# Patient Record
Sex: Female | Born: 1985 | Race: White | Hispanic: No | Smoking: Never smoker
Health system: Southern US, Community
[De-identification: ages and names within clinical notes are randomized; demographics above are authoritative.]

## PROBLEM LIST (undated history)

## (undated) DIAGNOSIS — O24419 Gestational diabetes mellitus in pregnancy, unspecified control: Secondary | ICD-10-CM

---

## 2006-02-16 ENCOUNTER — Inpatient Hospital Stay (HOSPITAL_COMMUNITY): Admission: AD | Admit: 2006-02-16 | Discharge: 2006-02-19 | Payer: Self-pay | Admitting: Obstetrics and Gynecology

## 2006-05-15 ENCOUNTER — Emergency Department (HOSPITAL_COMMUNITY): Admission: EM | Admit: 2006-05-15 | Discharge: 2006-05-15 | Payer: Self-pay | Admitting: Emergency Medicine

## 2006-11-09 ENCOUNTER — Emergency Department (HOSPITAL_COMMUNITY): Admission: EM | Admit: 2006-11-09 | Discharge: 2006-11-09 | Payer: Self-pay | Admitting: Family Medicine

## 2007-10-09 ENCOUNTER — Inpatient Hospital Stay (HOSPITAL_COMMUNITY): Admission: AD | Admit: 2007-10-09 | Discharge: 2007-10-09 | Payer: Self-pay | Admitting: Obstetrics and Gynecology

## 2007-11-02 ENCOUNTER — Inpatient Hospital Stay (HOSPITAL_COMMUNITY): Admission: RE | Admit: 2007-11-02 | Discharge: 2007-11-04 | Payer: Self-pay | Admitting: Obstetrics and Gynecology

## 2008-02-19 ENCOUNTER — Ambulatory Visit: Payer: Self-pay | Admitting: Family Medicine

## 2009-01-13 ENCOUNTER — Ambulatory Visit: Payer: Self-pay | Admitting: Family Medicine

## 2010-03-17 ENCOUNTER — Inpatient Hospital Stay (HOSPITAL_COMMUNITY): Admission: AD | Admit: 2010-03-17 | Discharge: 2010-03-17 | Payer: Self-pay | Admitting: Obstetrics and Gynecology

## 2010-03-19 ENCOUNTER — Inpatient Hospital Stay (HOSPITAL_COMMUNITY): Admission: AD | Admit: 2010-03-19 | Discharge: 2010-03-21 | Payer: Self-pay | Admitting: Obstetrics and Gynecology

## 2010-03-20 ENCOUNTER — Encounter (INDEPENDENT_AMBULATORY_CARE_PROVIDER_SITE_OTHER): Payer: Self-pay | Admitting: Obstetrics and Gynecology

## 2010-11-13 LAB — COMPREHENSIVE METABOLIC PANEL
AST: 14 U/L (ref 0–37)
Alkaline Phosphatase: 92 U/L (ref 39–117)
BUN: 5 mg/dL — ABNORMAL LOW (ref 6–23)
CO2: 20 mEq/L (ref 19–32)
Calcium: 8.6 mg/dL (ref 8.4–10.5)
Chloride: 110 mEq/L (ref 96–112)
Creatinine, Ser: 0.69 mg/dL (ref 0.4–1.2)
GFR calc non Af Amer: >60 mL/min (ref >60)
Glucose, Bld: 103 mg/dL — ABNORMAL HIGH (ref 70–99)
Potassium: 3.5 mEq/L (ref 3.5–5.1)
Sodium: 137 mEq/L (ref 135–145)
Total Protein: 6.2 g/dL (ref 6.0–8.3)

## 2010-11-13 LAB — CBC
HCT: 29.6 % — ABNORMAL LOW (ref 36.0–46.0)
HCT: 33.6 % — ABNORMAL LOW (ref 36.0–46.0)
Hemoglobin: 10 g/dL — ABNORMAL LOW (ref 12.0–15.0)
Hemoglobin: 11.5 g/dL — ABNORMAL LOW (ref 12.0–15.0)
MCV: 89.7 fL (ref 78.0–100.0)
Platelets: 184 10*3/uL (ref 150–400)
Platelets: 250 10*3/uL (ref 150–400)
WBC: 10.9 10*3/uL — ABNORMAL HIGH (ref 4.0–10.5)

## 2011-04-27 ENCOUNTER — Inpatient Hospital Stay (INDEPENDENT_AMBULATORY_CARE_PROVIDER_SITE_OTHER)
Admission: RE | Admit: 2011-04-27 | Discharge: 2011-04-27 | Disposition: A | Discharge status: Home / Self Care | Payer: BC Managed Care – PPO | Source: Ambulatory Visit | Attending: Family Medicine | Admitting: Family Medicine

## 2011-04-27 DIAGNOSIS — M719 Bursopathy, unspecified: Secondary | ICD-10-CM

## 2011-04-27 DIAGNOSIS — M799 Soft tissue disorder, unspecified: Secondary | ICD-10-CM

## 2011-05-20 LAB — LACTATE DEHYDROGENASE: LDH: 129

## 2011-05-20 LAB — CBC
HCT: 35.8 — ABNORMAL LOW
MCV: 89.1
Platelets: 271

## 2011-05-20 LAB — COMPREHENSIVE METABOLIC PANEL
ALT: 17
Calcium: 8.8
GFR calc Af Amer: >60
GFR calc non Af Amer: >60
Total Bilirubin: 0.5

## 2011-05-20 LAB — RPR: RPR Ser Ql: NONREACTIVE

## 2011-05-20 LAB — URIC ACID: Uric Acid, Serum: 5.1

## 2011-05-23 LAB — COMPREHENSIVE METABOLIC PANEL
ALT: <8
Albumin: 2.5 — ABNORMAL LOW
BUN: 8
CO2: 23
Calcium: 8.5
GFR calc Af Amer: >60
GFR calc non Af Amer: >60
Potassium: 4
Total Bilirubin: 0.8

## 2011-05-23 LAB — RPR: RPR Ser Ql: NONREACTIVE

## 2011-05-23 LAB — URIC ACID: Uric Acid, Serum: 5

## 2011-05-23 LAB — CBC
HCT: 30.4 — ABNORMAL LOW
HCT: 34.2 — ABNORMAL LOW
Hemoglobin: 11.8 — ABNORMAL LOW
MCHC: 34.6
MCV: 89.9
Platelets: 220
Platelets: 253
RBC: 3.8 — ABNORMAL LOW

## 2012-03-03 ENCOUNTER — Emergency Department (INDEPENDENT_AMBULATORY_CARE_PROVIDER_SITE_OTHER)
Admission: EM | Admit: 2012-03-03 | Discharge: 2012-03-03 | Disposition: A | Discharge status: Home / Self Care | Payer: Self-pay | Source: Home / Self Care | Attending: Emergency Medicine | Admitting: Emergency Medicine

## 2012-03-03 ENCOUNTER — Encounter (HOSPITAL_COMMUNITY): Payer: Self-pay | Admitting: *Deleted

## 2012-03-03 DIAGNOSIS — J020 Streptococcal pharyngitis: Secondary | ICD-10-CM

## 2012-03-03 MED ORDER — PENICILLIN G BENZATHINE 1200000 UNIT/2ML IM SUSP
1.2000 10*6.[IU] | Freq: Once | INTRAMUSCULAR | Status: AC
  Administered 2012-03-03: 1.2 10*6.[IU] via INTRAMUSCULAR

## 2012-03-03 MED ORDER — PENICILLIN G BENZATHINE 1200000 UNIT/2ML IM SUSP
INTRAMUSCULAR | Status: AC
  Filled 2012-03-03: qty 2

## 2012-03-03 NOTE — ED Provider Notes (Signed)
Chief Complaint  Patient presents with  . Sore Throat  . Fever    History of Present Illness:   Kristina Henderson is a 26 year old female who's had a three-day history of severe sore throat, and pain on swallowing. Her symptoms came on after she took her kids into their pediatrician's office. She also has had fever, chills, headache, muscle aches, left ear pain, and anorexia. She denies any nasal congestion, rhinorrhea, cough, or GI complaints.  Review of Systems:  Other than as noted above, the patient denies any of the following symptoms. Systemic:  No fever, chills, sweats, fatigue, myalgias, headache, or anorexia. Eye:  No redness, pain or drainage. ENT:  No earache, ear congestion, nasal congestion, sneezing, rhinorrhea, sinus pressure, sinus pain, or post nasal drip. Lungs:  No cough, sputum production, wheezing, shortness of breath, or chest pain. GI:  No abdominal pain, nausea, vomiting, or diarrhea. Skin:  No rash or itching.  PMFSH:  Past medical history, family history, social history, meds, allergies, and nurse's notes were reviewed.  There is no known exposure to strep or mono.  No prior history of step or mono.  The patient denies use of tobacco.  Physical Exam:   Vital signs:  BP 155/87  Pulse 86  Temp 98.1 F (36.7 C) (Oral)  Resp 18  SpO2 100% General:  Alert, in no distress. Eye:  No conjunctival injection or drainage. Lids were normal. ENT:  TMs and canals were normal, without erythema or inflammation.  Nasal mucosa was clear and uncongested, without drainage.  Mucous membranes were moist.  Exam of pharynx reveals tonsils to be enlarged and red with copious white exudate or.  There were no oral ulcerations or lesions. Neck:  Supple, with bilateral, tender anterior cervical adenopathy, no posterior cervical adenopathy. Lungs:  No respiratory distress.  Lungs were clear to auscultation, without wheezes, rales or rhonchi.  Breath sounds were clear and equal bilaterally.  Heart:   Regular rhythm, without gallops, murmers or rubs. Skin:  Clear, warm, and dry, without rash or lesions.  Labs:   Results for orders placed during the hospital encounter of 03/03/12  POCT RAPID STREP A ([facility] ONLY)      Component Value Range   Streptococcus, Group A Screen (Direct) POSITIVE (*) NEGATIVE   Course in Urgent Care Center:   She was given LA Bicillin 1.2 million units IM and tolerated this well without any immediate side effects.  Assessment:  The encounter diagnosis was Strep throat.  Plan:   1.  The following meds were prescribed:   New Prescriptions   No medications on file   2.  The patient was instructed in symptomatic care including hot saline gargles, throat lozenges, infectious precautions, and need to trade out toothbrush. Handouts were given. 3.  The patient was told to return if becoming worse in any way, if no better in 3 or 4 days, and given some red flag symptoms that would indicate earlier return.    Reuben Likes, MD 03/03/12 [id number]

## 2012-03-03 NOTE — ED Notes (Signed)
Pt with onset of fever and sore throat July 4th - advi 7am this morning - denies cough or congestion

## 2015-08-01 ENCOUNTER — Emergency Department (HOSPITAL_COMMUNITY)
Admission: EM | Admit: 2015-08-01 | Discharge: 2015-08-01 | Disposition: A | Discharge status: Home / Self Care | Payer: No Typology Code available for payment source | Attending: Emergency Medicine | Admitting: Emergency Medicine

## 2015-08-01 ENCOUNTER — Encounter (HOSPITAL_COMMUNITY): Payer: Self-pay | Admitting: Emergency Medicine

## 2015-08-01 ENCOUNTER — Emergency Department (HOSPITAL_COMMUNITY): Payer: No Typology Code available for payment source

## 2015-08-01 DIAGNOSIS — S199XXA Unspecified injury of neck, initial encounter: Secondary | ICD-10-CM | POA: Insufficient documentation

## 2015-08-01 DIAGNOSIS — S0990XA Unspecified injury of head, initial encounter: Secondary | ICD-10-CM | POA: Diagnosis not present

## 2015-08-01 DIAGNOSIS — Y998 Other external cause status: Secondary | ICD-10-CM | POA: Insufficient documentation

## 2015-08-01 DIAGNOSIS — M542 Cervicalgia: Secondary | ICD-10-CM

## 2015-08-01 DIAGNOSIS — Y9389 Activity, other specified: Secondary | ICD-10-CM | POA: Diagnosis not present

## 2015-08-01 DIAGNOSIS — Y92410 Unspecified street and highway as the place of occurrence of the external cause: Secondary | ICD-10-CM | POA: Insufficient documentation
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
## 2015-08-01 NOTE — Discharge Instructions (Signed)
Head Injury, Adult You have a head injury. Headaches and throwing up (vomiting) are common after a head injury. It should be easy to wake up from sleeping. Sometimes you must stay in the hospital. Most problems happen within the first 24 hours. Side effects may occur up to 7-10 days after the injury.  WHAT ARE THE TYPES OF HEAD INJURIES? Head injuries can be as minor as a bump. Some head injuries can be more severe. More severe head injuries include:  A jarring injury to the brain (concussion).  A bruise of the brain (contusion). This mean there is bleeding in the brain that can cause swelling.  A cracked skull (skull fracture).  Bleeding in the brain that collects, clots, and forms a bump (hematoma). WHEN SHOULD I GET HELP RIGHT AWAY?   You are confused or sleepy.  You cannot be woken up.  You feel sick to your stomach (nauseous) or keep throwing up (vomiting).  Your dizziness or unsteadiness is getting worse.  You have very bad, lasting headaches that are not helped by medicine. Take medicines only as told by your doctor.  You cannot use your arms or legs like normal.  You cannot walk.  You notice changes in the black spots in the center of the colored part of your eye (pupil).  You have clear or bloody fluid coming from your nose or ears.  You have trouble seeing. During the next 24 hours after the injury, you must stay with someone who can watch you. This person should get help right away (call 911 in the [place] if you start to shake and are not able to control it (have seizures), you pass out, or you are unable to wake up. HOW CAN I PREVENT A HEAD INJURY IN THE FUTURE?  Wear seat belts.  Wear a helmet while bike riding and playing sports like football.  Stay away from dangerous activities around the house. WHEN CAN I RETURN TO NORMAL ACTIVITIES AND ATHLETICS? See your doctor before doing these activities. You should not do normal activities or play contact sports until 1  week after the following symptoms have stopped:  Headache that does not go away.  Dizziness.  Poor attention.  Confusion.  Memory problems.  Sickness to your stomach or throwing up.  Tiredness.  Fussiness.  Bothered by bright lights or loud noises.  Anxiousness or depression.  Restless sleep. MAKE SURE YOU:   Understand these instructions.  Will watch your condition.  Will get help right away if you are not doing well or get worse.   This information is not intended to replace advice given to you by your health care provider. Make sure you discuss any questions you have with your health care provider.   Document Released: 07/28/2008 Document Revised: 09/05/2014 Document Reviewed: 04/22/2013 Elsevier Interactive Patient Education 2016 [employer]  Musculoskeletal Pain Musculoskeletal pain is muscle and boney aches and pains. These pains can occur in any part of the body. Your caregiver may treat you without knowing the cause of the pain. They may treat you if blood or urine tests, X-rays, and other tests were normal.  CAUSES There is often not a definite cause or reason for these pains. These pains may be caused by a type of germ (virus). The discomfort may also come from overuse. Overuse includes working out too hard when your body is not fit. Boney aches also come from weather changes. Bone is sensitive to atmospheric pressure changes. HOME CARE INSTRUCTIONS   Ask  when your test results will be ready. Make sure you get your test results. °· Only take over-the-counter or prescription medicines for pain, discomfort, or fever as directed by your caregiver. If you were given medications for your condition, do not drive, operate machinery or power tools, or sign legal documents for 24 hours. Do not drink alcohol. Do not take sleeping pills or other medications that may interfere with treatment. °· Continue all activities unless the activities cause more pain. When the pain  lessens, slowly resume normal activities. Gradually increase the intensity and duration of the activities or exercise. °· During periods of severe pain, bed rest may be helpful. Lay or sit in any position that is comfortable. °· Putting ice on the injured area. °¨ Put ice in a bag. °¨ Place a towel between your skin and the bag. °¨ Leave the ice on for 15 to 20 minutes, 3 to 4 times a day. °· Follow up with your caregiver for continued problems and no reason can be found for the pain. If the pain becomes worse or does not go away, it may be necessary to repeat tests or do additional testing. Your caregiver may need to look further for a possible cause. °SEEK IMMEDIATE MEDICAL CARE IF: °· You have pain that is getting worse and is not relieved by medications. °· You develop chest pain that is associated with shortness or breath, sweating, feeling sick to your stomach (nauseous), or throw up (vomit). °· Your pain becomes localized to the abdomen. °· You develop any new symptoms that seem different or that concern you. °MAKE SURE YOU:  °· Understand these instructions. °· Will watch your condition. °· Will get help right away if you are not doing well or get worse. °  °This information is not intended to replace advice given to you by your health care provider. Make sure you discuss any questions you have with your health care provider. °  °Document Released: 08/15/2005 Document Revised: 11/07/2011 Document Reviewed: 04/19/2013 °Elsevier Interactive Patient Education ©2016 Elsevier Inc. ° °

## 2015-12-27 ENCOUNTER — Encounter (HOSPITAL_COMMUNITY): Payer: Self-pay | Admitting: *Deleted

## 2015-12-27 ENCOUNTER — Emergency Department (HOSPITAL_COMMUNITY): Payer: No Typology Code available for payment source

## 2015-12-27 ENCOUNTER — Emergency Department (HOSPITAL_COMMUNITY)
Admission: EM | Admit: 2015-12-27 | Discharge: 2015-12-27 | Disposition: A | Discharge status: Home / Self Care | Payer: No Typology Code available for payment source | Attending: Emergency Medicine | Admitting: Emergency Medicine

## 2015-12-27 DIAGNOSIS — S9031XA Contusion of right foot, initial encounter: Secondary | ICD-10-CM | POA: Insufficient documentation

## 2015-12-27 DIAGNOSIS — S82434A Nondisplaced oblique fracture of shaft of right fibula, initial encounter for closed fracture: Secondary | ICD-10-CM | POA: Insufficient documentation

## 2015-12-27 DIAGNOSIS — X58XXXA Exposure to other specified factors, initial encounter: Secondary | ICD-10-CM | POA: Insufficient documentation

## 2015-12-27 DIAGNOSIS — S82401A Unspecified fracture of shaft of right fibula, initial encounter for closed fracture: Secondary | ICD-10-CM

## 2015-12-27 DIAGNOSIS — Y998 Other external cause status: Secondary | ICD-10-CM | POA: Insufficient documentation

## 2015-12-27 DIAGNOSIS — Y9289 Other specified places as the place of occurrence of the external cause: Secondary | ICD-10-CM | POA: Insufficient documentation

## 2015-12-27 DIAGNOSIS — Z8632 Personal history of gestational diabetes: Secondary | ICD-10-CM | POA: Insufficient documentation

## 2015-12-27 DIAGNOSIS — S93401A Sprain of unspecified ligament of right ankle, initial encounter: Secondary | ICD-10-CM

## 2015-12-27 DIAGNOSIS — Y9389 Activity, other specified: Secondary | ICD-10-CM | POA: Insufficient documentation

## 2015-12-27 HISTORY — DX: Gestational diabetes mellitus in pregnancy, unspecified control: O24.419

## 2015-12-27 MED ORDER — HYDROCODONE-ACETAMINOPHEN 5-325 MG PO TABS: ORAL_TABLET | ORAL | Status: AC

## 2015-12-27 MED ORDER — HYDROCODONE-ACETAMINOPHEN 5-325 MG PO TABS
1.0000 | ORAL_TABLET | Freq: Once | ORAL | Status: AC
  Administered 2015-12-27: 1 via ORAL
  Filled 2015-12-27: qty 1
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
## 2015-12-27 NOTE — Discharge Instructions (Signed)
Please read and follow all provided instructions.  Your diagnoses today include:  1. Fibula fracture, right, closed, initial encounter   2. Sprain of right ankle, initial encounter     Tests performed today include:  An x-ray of your ankle - shows that the end of your fibula is broken  Vital signs. See below for your results today.   Medications prescribed:   Vicodin (hydrocodone/acetaminophen) - narcotic pain medication  DO NOT drive or perform any activities that require you to be awake and alert because this medicine can make you drowsy. BE VERY CAREFUL not to take multiple medicines containing Tylenol (also called acetaminophen). Doing so can lead to an overdose which can damage your liver and cause liver failure and possibly death.  Take any prescribed medications only as directed.  Home care instructions:   Follow any educational materials contained in this packet  Follow R.I.C.E. Protocol:  R - rest your injury   I  - use ice on injury without applying directly to skin  C - compress injury with bandage or splint  E - elevate the injury as much as possible  Follow-up instructions: Please follow-up with your primary care provider or the provided orthopedic (bone specialist) if you continue to have significant pain or trouble walking in 1 week. In this case you may have a severe sprain that requires further care.   Return instructions:   Please return if your toes are numb or tingling, appear gray or blue, or you have severe pain (also elevate leg and loosen splint or wrap)  Please return to the Emergency Department if you experience worsening symptoms.   Please return if you have any other emergent concerns.  Additional Information:  Your vital signs today were: BP 129/87 mmHg   Pulse 76   Temp(Src) 98.5 F (36.9 C) (Oral)   Resp 16   Ht 5\' 10"  (1.778 m)   Wt 104.327 kg   BMI 33.00 kg/m2   SpO2 100% If your blood pressure (BP) was elevated above 135/85 this  visit, please have this repeated by your doctor within one month. --------------

## 2015-12-27 NOTE — ED Provider Notes (Signed)
CSN: [id number]     Arrival date & time 12/27/15  2157 History  By signing my name below, I, Doreatha Martin, attest that this documentation has been prepared under the direction and in the presence of Renne Crigler, PA-C. Electronically Signed: Doreatha Martin, ED Scribe. 12/27/2015. 10:17 PM.    Chief Complaint  Patient presents with  . Ankle Injury   The history is provided by the patient. No language interpreter was used.   HPI Comments: Kristina Henderson is a 30 y.o. female who presents to the Emergency Department complaining of moderate, worsening dorsal right foot pain onset 3 days ago s/p injury with associated ecchymosis and right ankle swelling. Pt states that she inverted her ankle as she stepped off her bed, causing her immediate pain, swelling and bruising. Denies additional trauma or injuries, falls. Pt is ambulatory, but with some difficulty secondary to pain. Pt states that pain is worsened with movement, ambulation and weight-bearing. She reports some relief with antiinflammatories and RICE method. Denies numbness, weakness, paresthesia, additional injuries. Pt is not currently followed by a PCP.    Past Medical History  Diagnosis Date  . Gestational diabetes    History reviewed. No pertinent past surgical history. No family history on file. Social History  Substance Use Topics  . Smoking status: Never Smoker   . Smokeless tobacco: Never Used  . Alcohol Use: No   OB History    No data available     Review of Systems  Constitutional: Negative for activity change.  Musculoskeletal: Positive for joint swelling (right ankle) and arthralgias (right ankle). Negative for back pain and neck pain.  Skin: Positive for color change (ecchymosis). Negative for wound.  Neurological: Negative for weakness and numbness.       Negative for paresthesias   Allergies  Ceclor; Naproxen; and Sulfa antibiotics  Home Medications   Prior to Admission medications   Medication Sig Start Date End  Date Taking? Authorizing Provider  ibuprofen (ADVIL,MOTRIN) 200 MG tablet Take 800 mg by mouth every 6 (six) hours as needed.    Historical Provider, MD  levonorgestrel (MIRENA) 20 MCG/24HR IUD 1 each by Intrauterine route once.    Historical Provider, MD   BP 129/87 mmHg  Pulse 76  Temp(Src) 98.5 F (36.9 C) (Oral)  Resp 16  Ht  (1.778 m)  Wt 230 lb (104.327 kg)  BMI 33.00 kg/m2  SpO2 100% Physical Exam  Constitutional: She appears well-developed and well-nourished.  HENT:  Head: Normocephalic and atraumatic.  Eyes: Conjunctivae are normal. Pupils are equal, round, and reactive to light.  Neck: Normal range of motion. Neck supple.  Cardiovascular: Normal rate.  Exam reveals no decreased pulses.   Pulmonary/Chest: Effort normal. No respiratory distress.  Abdominal: She exhibits no distension.  Musculoskeletal: Normal range of motion. She exhibits edema and tenderness.  Tenderness to the lateral malleolus and dorsum of the right foot. Dependent edema. Diffuse ecchymosis in dependent areas. No obvious deformity. No joint effusion. Able to plantar and dorsiflex. Able to flex and extend toes. Capillary refill less than 3 seconds. No proximal fibular tenderness.   Neurological: She is alert. No sensory deficit. Coordination normal.  Sensation equal and intact bilaterally. DP pulses 2+ and equal. Coordination intact. Antalgic gait.   Skin: Skin is warm and dry.  Psychiatric: She has a normal mood and affect. Her behavior is normal.  Nursing note and vitals reviewed.   ED Course  Procedures (including critical care time) DIAGNOSTIC STUDIES: Oxygen Saturation is  99% on RA, normal by my interpretation.    COORDINATION OF CARE: 10:12 PM Discussed treatment plan with pt at bedside which includes XR and pt agreed to plan.   Imaging Review Dg Ankle Complete Right  12/27/2015  CLINICAL DATA:  Pain and swelling after injury 3 days ago. EXAM: RIGHT ANKLE - COMPLETE 3+ VIEW COMPARISON:   None. FINDINGS: There is a nondisplaced well aligned oblique fracture of the distal fibula. The mortise is symmetric. There is no radiopaque foreign body. IMPRESSION: Distal fibular fracture. Electronically Signed   By: Ellery Plunkaniel R Mitchell M.D.   On: 12/27/2015 22:34   I have personally reviewed and evaluated these images as part of my medical decision-making.  Patient counseled on use of narcotic pain medications. Counseled not to combine these medications with others containing tylenol. Urged not to drink alcohol, drive, or perform any other activities that requires focus while taking these medications. The patient verbalizes understanding and agrees with the plan.    MDM   Final diagnoses:  None    Patient X-Ray shows distal fibular fracture.  Pt advised to follow up with orthopedics. Patient given cam walker, crutches while in ED, conservative therapy recommended and discussed. Patient will be discharged home & is agreeable with above plan. Returns precautions discussed. Pt appears safe for discharge.   I personally performed the services described in this documentation, which was scribed in my presence. The recorded information has been reviewed and is accurate.   Renne CriglerJoshua Coyle Stordahl, PA-C 12/27/15 2259  Tilden FossaElizabeth Rees, MD 12/31/15 [phone]

## 2017-09-14 ENCOUNTER — Encounter (HOSPITAL_COMMUNITY): Payer: Self-pay | Admitting: Emergency Medicine

## 2017-09-14 ENCOUNTER — Ambulatory Visit (INDEPENDENT_AMBULATORY_CARE_PROVIDER_SITE_OTHER): Payer: Managed Care, Other (non HMO)

## 2017-09-14 ENCOUNTER — Ambulatory Visit (HOSPITAL_COMMUNITY)
Admission: EM | Admit: 2017-09-14 | Discharge: 2017-09-14 | Disposition: A | Discharge status: Home / Self Care | Payer: Managed Care, Other (non HMO) | Attending: Internal Medicine | Admitting: Internal Medicine

## 2017-09-14 ENCOUNTER — Other Ambulatory Visit: Payer: Self-pay

## 2017-09-14 DIAGNOSIS — B349 Viral infection, unspecified: Secondary | ICD-10-CM

## 2017-09-14 DIAGNOSIS — R05 Cough: Secondary | ICD-10-CM

## 2017-09-14 MED ORDER — FLUTICASONE PROPIONATE 50 MCG/ACT NA SUSP: 2.0000 | Freq: Every day | NASAL | 0 refills | Status: AC

## 2017-09-14 MED ORDER — BENZONATATE 100 MG PO CAPS: 100.0000 mg | ORAL_CAPSULE | Freq: Three times a day (TID) | ORAL | 0 refills | Status: AC

## 2017-09-14 MED ORDER — CETIRIZINE-PSEUDOEPHEDRINE ER 5-120 MG PO TB12: 1.0000 | ORAL_TABLET | Freq: Every day | ORAL | 0 refills | Status: AC

## 2017-09-14 NOTE — ED Provider Notes (Signed)
[facility]    CSN: [id number] Arrival date & time: 09/14/17  1845     History   Chief Complaint Chief Complaint  Patient presents with  . URI    HPI Kristina Henderson is a 32 y.o. female.   32 year old female comes in for 2-day history of URI symptoms.  States she has had nasal congestion, drainage, productive cough, body aches.  Subjective fever with chills, last antipyretics 4 hours ago. States feels that she cannot take deep breaths. otc cold medications without relief. No history of asthma. Never smoker.       Past Medical History:  Diagnosis Date  . Gestational diabetes     There are no active problems to display for this patient.   History reviewed. No pertinent surgical history.  OB History    No data available       Home Medications    Prior to Admission medications   Medication Sig Start Date End Date Taking? Authorizing Provider  levonorgestrel (MIRENA) 20 MCG/24HR IUD 1 each by Intrauterine route once.   Yes [provider]  benzonatate (TESSALON) 100 MG capsule Take 1 capsule (100 mg total) by mouth every 8 (eight) hours. 09/14/17   Cathie Hoops, Zachory Mangual V, PA-C  cetirizine-pseudoephedrine (ZYRTEC-D) 5-120 MG tablet Take 1 tablet by mouth daily. 09/14/17   Cathie Hoops, Sinclair Arrazola V, PA-C  fluticasone (FLONASE) 50 MCG/ACT nasal spray Place 2 sprays into both nostrils daily. 09/14/17   Belinda Fisher, PA-C  HYDROcodone-acetaminophen (NORCO/VICODIN) 5-325 MG tablet Take 1-2 tablets every 6 hours as needed for severe pain 12/27/15   Renne Crigler, PA-C  ibuprofen (ADVIL,MOTRIN) 200 MG tablet Take 800 mg by mouth every 6 (six) hours as needed.    [provider]    Family History History reviewed. No pertinent family history.  Social History Social History   Tobacco Use  . Smoking status: Never Smoker  . Smokeless tobacco: Never Used  Substance Use Topics  . Alcohol use: No  . Drug use: No     Allergies   Ceclor [cefaclor]; Naproxen; and Sulfa  antibiotics   Review of Systems Review of Systems  Reason unable to perform ROS: See HPI as above.     Physical Exam Triage Vital Signs ED Triage Vitals  Enc Vitals Group     BP 09/14/17 1913 (!) 152/76     Pulse Rate 09/14/17 1913 90     Resp --      Temp 09/14/17 1913 100.3 F (37.9 C)     Temp Source 09/14/17 1913 Oral     SpO2 09/14/17 1913 99 %     Weight --      Height --      Head Circumference --      Peak Flow --      Pain Score 09/14/17 1914 4     Pain Loc --      Pain Edu? --      Excl. in GC? --    No data found.  Updated Vital Signs BP (!) 152/76 (BP Location: Left Arm)   Pulse 90   Temp 100.3 F (37.9 C) (Oral)   SpO2 99%   Physical Exam  Constitutional: She is oriented to person, place, and time. She appears well-developed and well-nourished. No distress.  HENT:  Head: Normocephalic and atraumatic.  Right Ear: Tympanic membrane, external ear and ear canal normal. Tympanic membrane is not erythematous and not bulging.  Left Ear: Tympanic membrane, external ear and ear  canal normal. Tympanic membrane is not erythematous and not bulging.  Nose: Mucosal edema and rhinorrhea present. Right sinus exhibits no maxillary sinus tenderness and no frontal sinus tenderness. Left sinus exhibits no maxillary sinus tenderness and no frontal sinus tenderness.  Mouth/Throat: Uvula is midline, oropharynx is clear and moist and mucous membranes are normal.  Eyes: Conjunctivae are normal. Pupils are equal, round, and reactive to light.  Neck: Normal range of motion. Neck supple.  Cardiovascular: Normal rate, regular rhythm and normal heart sounds. Exam reveals no gallop and no friction rub.  No murmur heard. Pulmonary/Chest: Effort normal.  Diffuse wheezing and rhonchi that mostly resolved with cough.  Residual crackles in the left lower base.  Lymphadenopathy:    She has no cervical adenopathy.  Neurological: She is alert and oriented to person, place, and time.    Skin: Skin is warm and dry.  Psychiatric: She has a normal mood and affect. Her behavior is normal. Judgment normal.     UC Treatments / Results  Labs (all labs ordered are listed, but only abnormal results are displayed) Labs Reviewed - No data to display  EKG  EKG Interpretation None       Radiology Dg Chest 2 View  Result Date: 09/14/2017 CLINICAL DATA:  Fever and cough since Tuesday. EXAM: CHEST  2 VIEW COMPARISON:  None. FINDINGS: The heart size and mediastinal contours are within normal limits. There is no focal infiltrate, pulmonary edema, or pleural effusion. The visualized skeletal structures are unremarkable. IMPRESSION: No active cardiopulmonary disease. Electronically Signed   By: Sherian ReinWei-Chen  Lin M.D.   On: 09/14/2017 20:12    Procedures Procedures (including critical care time)  Medications Ordered in UC Medications - No data to display   Initial Impression / Assessment and Plan / UC Course  I have reviewed the triage vital signs and the nursing notes.  Pertinent labs & imaging results that were available during my care of the patient were reviewed by me and considered in my medical decision making (see chart for details).    Chest x-ray negative for pneumonia.  Discussed with patient most likely due to viral illness.  Symptomatic treatment discussed.  Return precautions given.  Patient expresses understanding and agrees to plan.  Final Clinical Impressions(s) / UC Diagnoses   Final diagnoses:  Viral syndrome    ED Discharge Orders        Ordered    benzonatate (TESSALON) 100 MG capsule  Every 8 hours     09/14/17 2018    cetirizine-pseudoephedrine (ZYRTEC-D) 5-120 MG tablet  Daily     09/14/17 2018    fluticasone (FLONASE) 50 MCG/ACT nasal spray  Daily     09/14/17 2018       Belinda FisherYu, Leiland Mihelich V, PA-C 09/14/17 2021

## 2017-09-14 NOTE — Discharge Instructions (Signed)
Chest x-ray negative for pneumonia. Tessalon for cough. Start flonase, zyrtec-D for nasal congestion. You can use over the counter nasal saline rinse such as neti pot for nasal congestion. Keep hydrated, your urine should be clear to pale yellow in color. Tylenol/motrin for fever and pain. Monitor for any worsening of symptoms, chest pain, shortness of breath, wheezing, swelling of the throat, follow up for reevaluation.   For sore throat try using a honey-based tea. Use 3 teaspoons of honey with juice squeezed from half lemon. Place shaved pieces of ginger into 1/2-1 cup of water and warm over stove top. Then mix the ingredients and repeat every 4 hours as needed.

## 2017-09-14 NOTE — ED Triage Notes (Signed)
Pt reports nasal congestion and drainage, productive cough, and unmeasured fever that started Tuesday night.

## 2018-11-27 ENCOUNTER — Emergency Department (HOSPITAL_COMMUNITY): Payer: Managed Care, Other (non HMO)

## 2018-11-27 ENCOUNTER — Other Ambulatory Visit: Payer: Self-pay

## 2018-11-27 ENCOUNTER — Emergency Department (HOSPITAL_COMMUNITY)
Admission: EM | Admit: 2018-11-27 | Discharge: 2018-11-27 | Disposition: A | Discharge status: Home / Self Care | Payer: Managed Care, Other (non HMO) | Attending: Emergency Medicine | Admitting: Emergency Medicine

## 2018-11-27 ENCOUNTER — Encounter (HOSPITAL_COMMUNITY): Payer: Self-pay | Admitting: Emergency Medicine

## 2018-11-27 DIAGNOSIS — Y939 Activity, unspecified: Secondary | ICD-10-CM | POA: Diagnosis not present

## 2018-11-27 DIAGNOSIS — Y929 Unspecified place or not applicable: Secondary | ICD-10-CM | POA: Diagnosis not present

## 2018-11-27 DIAGNOSIS — Z79899 Other long term (current) drug therapy: Secondary | ICD-10-CM | POA: Diagnosis not present

## 2018-11-27 DIAGNOSIS — Y999 Unspecified external cause status: Secondary | ICD-10-CM | POA: Insufficient documentation

## 2018-11-27 DIAGNOSIS — S93492A Sprain of other ligament of left ankle, initial encounter: Secondary | ICD-10-CM

## 2018-11-27 DIAGNOSIS — X501XXA Overexertion from prolonged static or awkward postures, initial encounter: Secondary | ICD-10-CM | POA: Diagnosis not present

## 2018-11-27 DIAGNOSIS — S93432A Sprain of tibiofibular ligament of left ankle, initial encounter: Secondary | ICD-10-CM | POA: Insufficient documentation

## 2018-11-27 MED ORDER — HYDROCODONE-ACETAMINOPHEN 5-325 MG PO TABS
2.0000 | ORAL_TABLET | Freq: Once | ORAL | Status: AC
  Administered 2018-11-27: 2 via ORAL
  Filled 2018-11-27: qty 2

## 2018-11-27 MED ORDER — IBUPROFEN 800 MG PO TABS: 800.0000 mg | ORAL_TABLET | Freq: Three times a day (TID) | ORAL | 0 refills | Status: AC

## 2018-11-27 NOTE — ED Triage Notes (Signed)
Pt c/o left ankle after a fall down stairs last night at 2230.

## 2018-11-27 NOTE — ED Provider Notes (Signed)
[facility] EMERGENCY DEPARTMENT Provider Note   CSN: [id number] Arrival date & time: 11/27/18  0409    History   Chief Complaint Chief Complaint  Patient presents with  . Ankle Pain    HPI Kristina Henderson is a 33 y.o. female.     Patient presents to the emergency department with a chief complaint of left ankle pain.  She states that she rolled her ankle last night at about 10:30 PM going down the stairs.  She denies any treatments prior.  States the pain is worsened with palpation and movement.  Denies any numbness or tingling.  Denies any other associated symptoms.  The history is provided by the patient. No language interpreter was used.    Past Medical History:  Diagnosis Date  . Gestational diabetes     There are no active problems to display for this patient.   History reviewed. No pertinent surgical history.   OB History   No obstetric history on file.      Home Medications    Prior to Admission medications   Medication Sig Start Date End Date Taking? Authorizing Provider  benzonatate (TESSALON) 100 MG capsule Take 1 capsule (100 mg total) by mouth every 8 (eight) hours. 09/14/17   Cathie Hoops, Amy V, PA-C  cetirizine-pseudoephedrine (ZYRTEC-D) 5-120 MG tablet Take 1 tablet by mouth daily. 09/14/17   Cathie Hoops, Amy V, PA-C  fluticasone (FLONASE) 50 MCG/ACT nasal spray Place 2 sprays into both nostrils daily. 09/14/17   Belinda Fisher, PA-C  HYDROcodone-acetaminophen (NORCO/VICODIN) 5-325 MG tablet Take 1-2 tablets every 6 hours as needed for severe pain 12/27/15   Renne Crigler, PA-C  ibuprofen (ADVIL,MOTRIN) 200 MG tablet Take 800 mg by mouth every 6 (six) hours as needed.    [provider]  levonorgestrel (MIRENA) 20 MCG/24HR IUD 1 each by Intrauterine route once.    [provider]    Family History No family history on file.  Social History Social History   Tobacco Use  . Smoking status: Never Smoker  . Smokeless tobacco: Never Used   Substance Use Topics  . Alcohol use: No  . Drug use: No     Allergies   Ceclor [cefaclor]; Naproxen; and Sulfa antibiotics   Review of Systems Review of Systems  All other systems reviewed and are negative.    Physical Exam Updated Vital Signs BP (!) 145/96 (BP Location: Right Arm)   Pulse 79   Temp 99 F (37.2 C) (Oral)   Resp 18   SpO2 99%   Physical Exam Nursing note and vitals reviewed.  Constitutional: Pt appears well-developed and well-nourished. No distress.  HENT:  Head: Normocephalic and atraumatic.  Eyes: Conjunctivae are normal.  Neck: Normal range of motion.  Cardiovascular: Normal rate, regular rhythm. Intact distal pulses.   Capillary refill < 3 sec.  Pulmonary/Chest: Effort normal and breath sounds normal.  Musculoskeletal:  LLE Pt exhibits TTP over the left ankle laterally and medially without noticeable swelling or bruising.   ROM: 4/5  Strength: 4/5  Neurological: Pt  is alert. Coordination normal.  Sensation: 5/5 Skin: Skin is warm and dry. Pt is not diaphoretic.  No evidence of open wound or skin tenting Psychiatric: Pt has a normal mood and affect.     ED Treatments / Results  Labs (all labs ordered are listed, but only abnormal results are displayed) Labs Reviewed - No data to display  EKG None  Radiology No results found.  Procedures Procedures (including  critical care time)  Medications Ordered in ED Medications  HYDROcodone-acetaminophen (NORCO/VICODIN) 5-325 MG per tablet 2 tablet (has no administration in time range)     Initial Impression / Assessment and Plan / ED Course  I have reviewed the triage vital signs and the nursing notes.  Pertinent labs & imaging results that were available during my care of the patient were reviewed by me and considered in my medical decision making (see chart for details).       Patient presents with injury to left ankle.  DDx includes, fracture, strain, or sprain.   Consultants: none  Plain films reveal no fracture or dislocation.  Pt advised to follow up with PCP and/or orthopedics. Patient given ankle ASO and crutches while in ED, conservative therapy such as RICE recommended and discussed.   Patient will be discharged home & is agreeable with above plan. Returns precautions discussed. Pt appears safe for discharge.   Final Clinical Impressions(s) / ED Diagnoses   Final diagnoses:  Sprain of anterior talofibular ligament of left ankle, initial encounter    ED Discharge Orders    None       Roxy Horseman, PA-C 11/27/18 0533    Glynn Octave, MD 11/27/18 [phone]

## 2018-11-27 NOTE — Discharge Instructions (Addendum)
Use the crutches and ankle brace as needed.  Let pain be your guide.

## 2020-06-24 IMAGING — CR LEFT ANKLE COMPLETE - 3+ VIEW
3 series · 3 of 3 positions shown · non-contrast
Comparison: None.

CLINICAL DATA: Left ankle pain after a fall down stairs last night.

EXAM:
LEFT ANKLE COMPLETE - 3+ VIEW

[ankle ap]
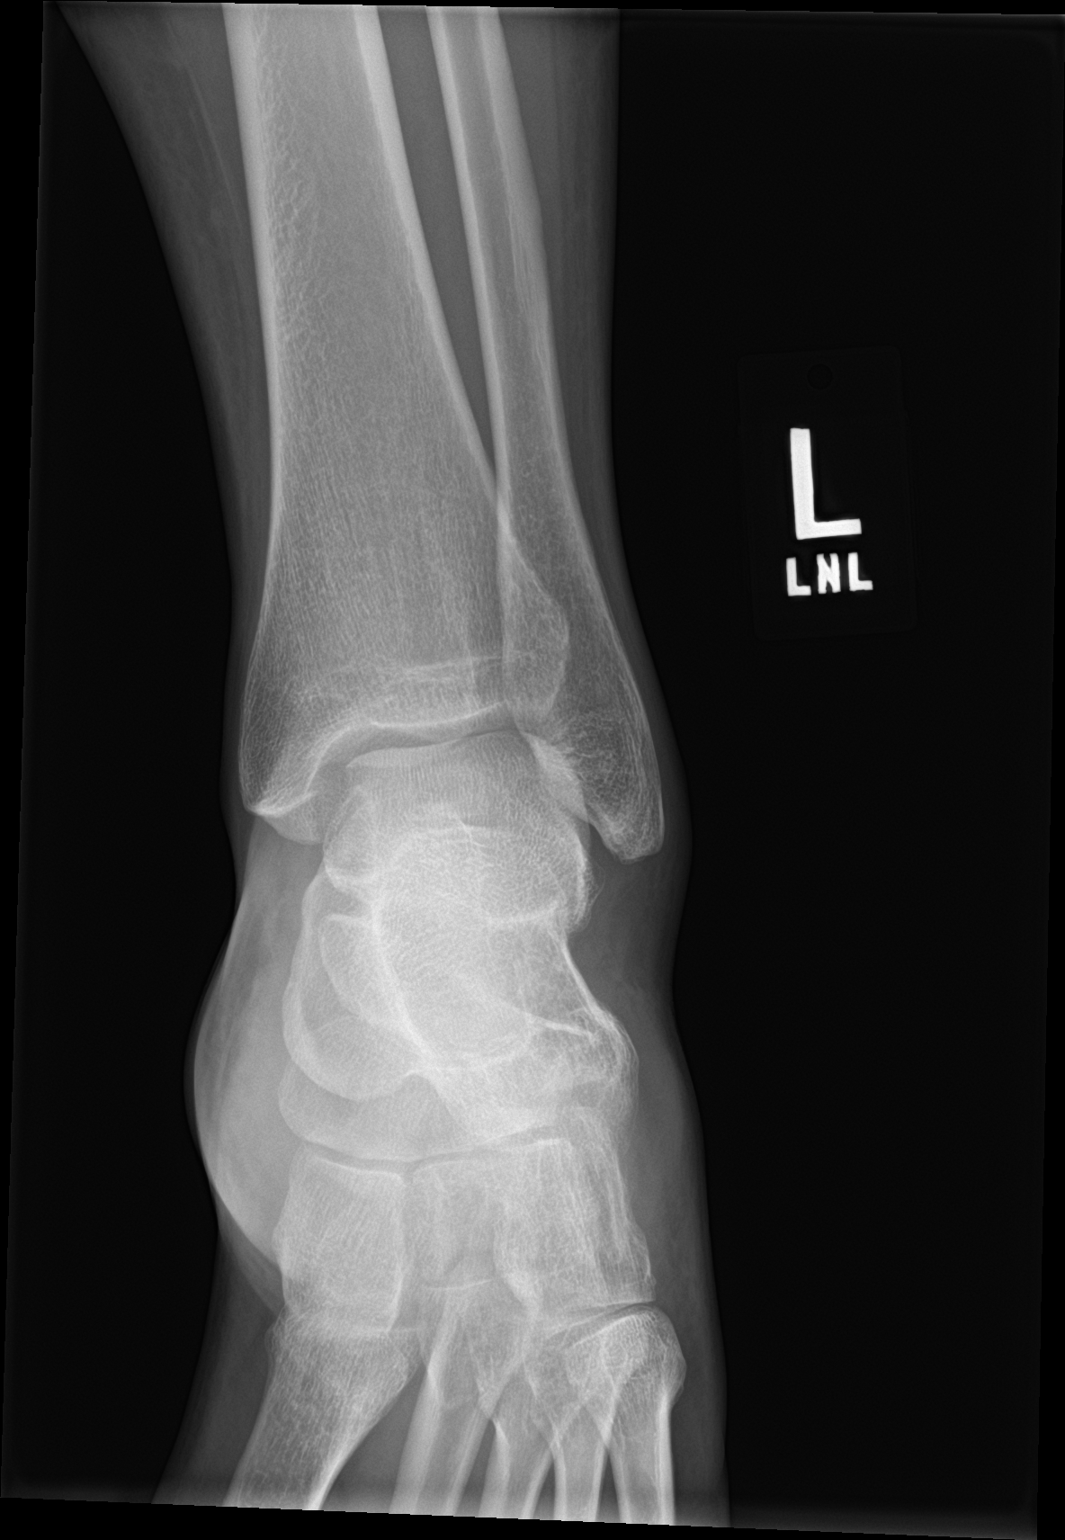

[ankle obl]
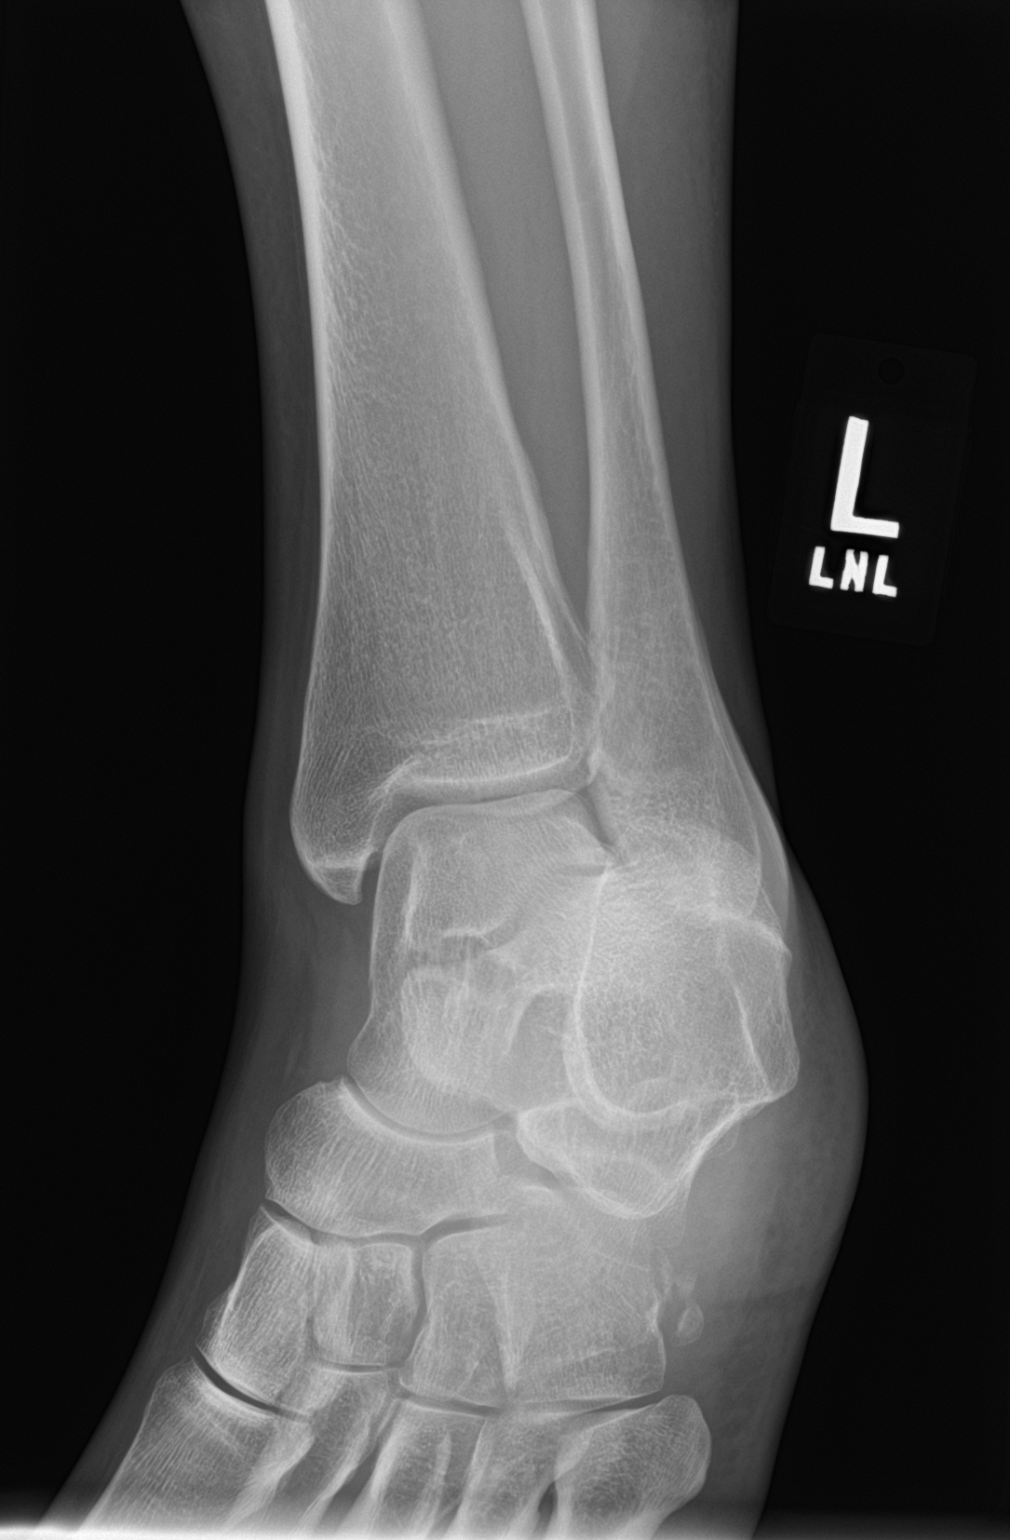

[ankle lat]
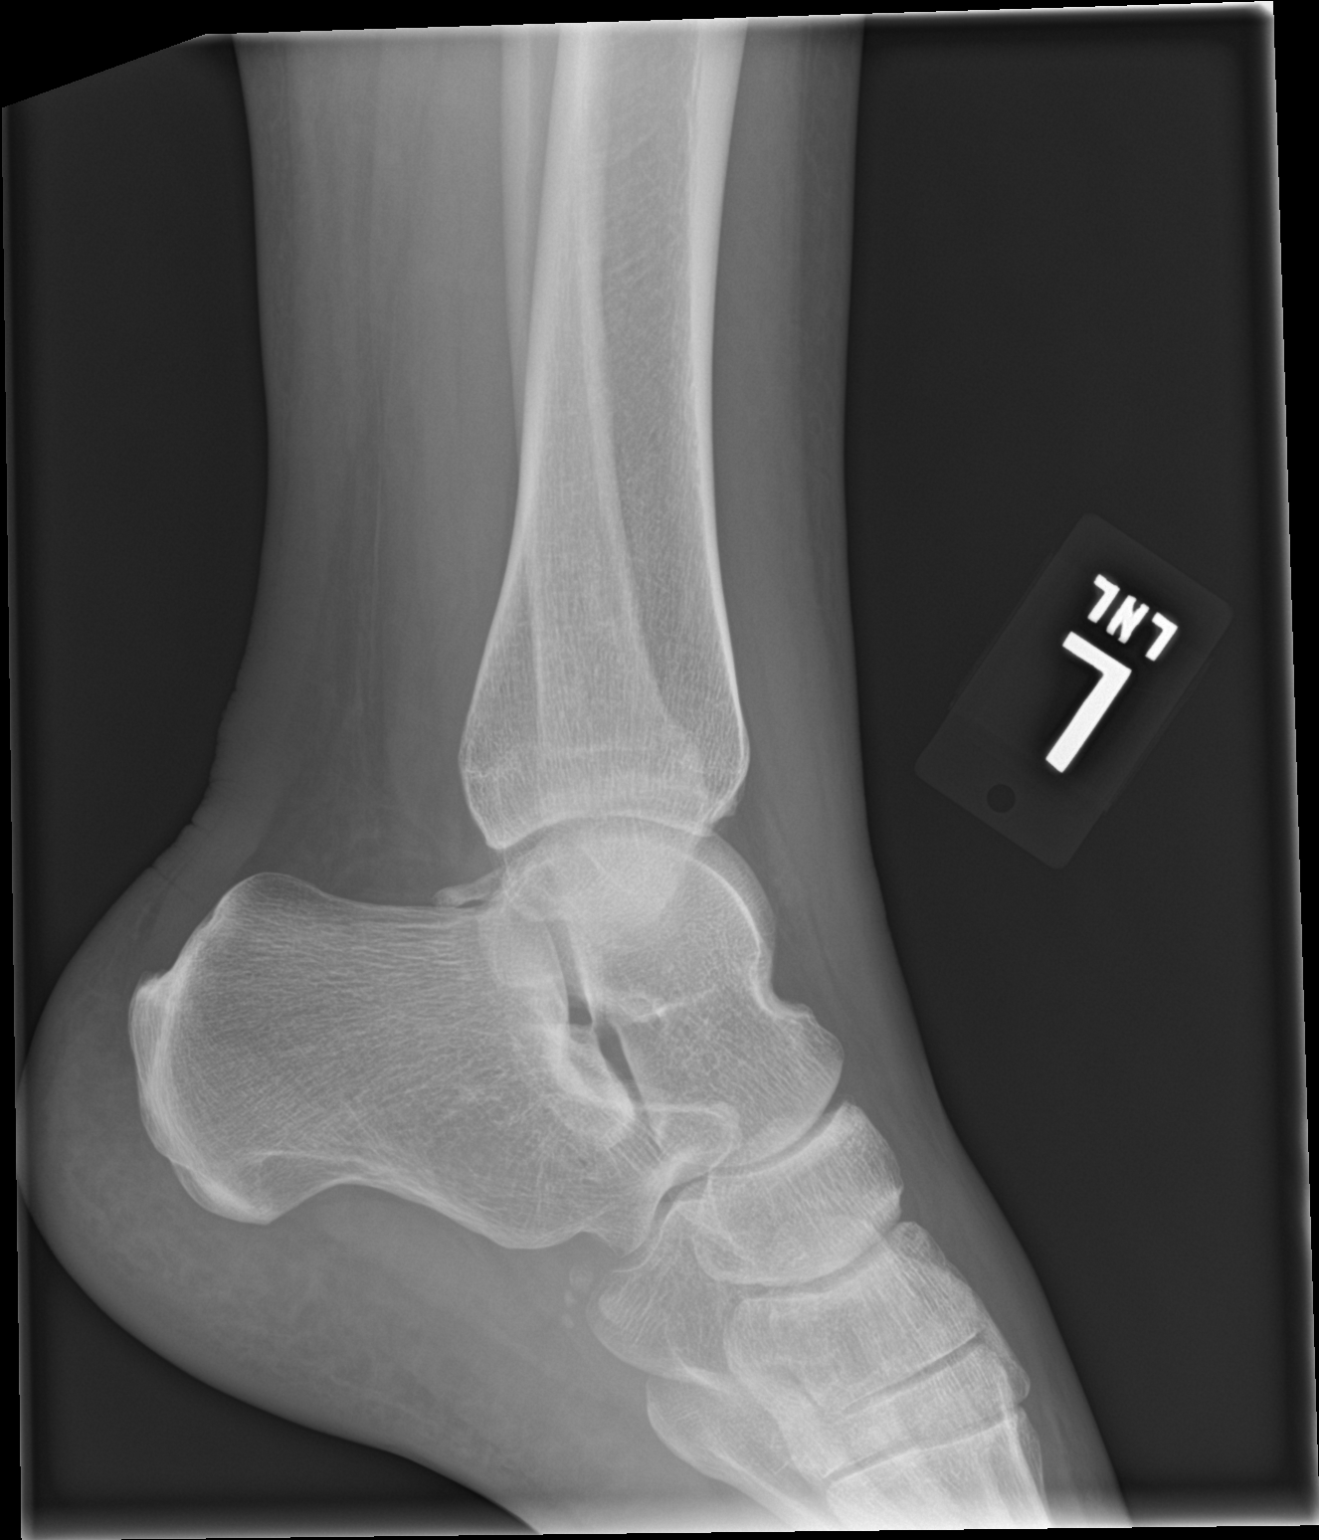

[3 of 3 positions shown; findings below may reference images not displayed]

FINDINGS: There is no evidence of fracture, dislocation, or joint effusion.
There is no evidence of arthropathy or other focal bone abnormality.
Soft tissues are unremarkable.
IMPRESSION: Negative.

## 2020-11-19 ENCOUNTER — Other Ambulatory Visit: Payer: Self-pay

## 2020-11-19 ENCOUNTER — Encounter (HOSPITAL_COMMUNITY): Payer: Self-pay | Admitting: Urgent Care

## 2020-11-19 ENCOUNTER — Ambulatory Visit (INDEPENDENT_AMBULATORY_CARE_PROVIDER_SITE_OTHER): Payer: No Typology Code available for payment source

## 2020-11-19 ENCOUNTER — Ambulatory Visit (HOSPITAL_COMMUNITY)
Admission: EM | Admit: 2020-11-19 | Discharge: 2020-11-19 | Disposition: A | Discharge status: Home / Self Care | Payer: No Typology Code available for payment source | Attending: Urgent Care | Admitting: Urgent Care

## 2020-11-19 DIAGNOSIS — R6884 Jaw pain: Secondary | ICD-10-CM

## 2020-11-19 DIAGNOSIS — S0083XA Contusion of other part of head, initial encounter: Secondary | ICD-10-CM

## 2020-11-19 MED ORDER — MELOXICAM 15 MG PO TABS: 7.5000 mg | ORAL_TABLET | Freq: Every day | ORAL | 0 refills | Status: AC

## 2020-11-19 MED ORDER — TIZANIDINE HCL 4 MG PO TABS: 4.0000 mg | ORAL_TABLET | Freq: Every day | ORAL | 0 refills | Status: AC

## 2020-11-19 NOTE — ED Provider Notes (Signed)
[facility]   MRN: [id number] DOB: 1985/10/16  Subjective:   Kristina Henderson is a 35 y.o. female presenting for 4 day history of persistent moderate-severe chin pain that radiates and right jaw. Symptoms started after her dog jumped up into her chin. Has had difficulty opening her mouth fully, feels popping.  Has used ibuprofen with minimal relief.   No current facility-administered medications for this encounter.  Current Outpatient Medications:  .  benzonatate (TESSALON) 100 MG capsule, Take 1 capsule (100 mg total) by mouth every 8 (eight) hours., Disp: 21 capsule, Rfl: 0 .  cetirizine-pseudoephedrine (ZYRTEC-D) 5-120 MG tablet, Take 1 tablet by mouth daily., Disp: 15 tablet, Rfl: 0 .  fluticasone (FLONASE) 50 MCG/ACT nasal spray, Place 2 sprays into both nostrils daily., Disp: 1 g, Rfl: 0 .  HYDROcodone-acetaminophen (NORCO/VICODIN) 5-325 MG tablet, Take 1-2 tablets every 6 hours as needed for severe pain, Disp: 12 tablet, Rfl: 0 .  ibuprofen (ADVIL,MOTRIN) 800 MG tablet, Take 1 tablet (800 mg total) by mouth 3 (three) times daily., Disp: 21 tablet, Rfl: 0 .  levonorgestrel (MIRENA) 20 MCG/24HR IUD, 1 each by Intrauterine route once., Disp: , Rfl:    Allergies  Allergen Reactions  . Ceclor [Cefaclor]   . Naproxen   . Sulfa Antibiotics     Past Medical History:  Diagnosis Date  . Gestational diabetes      No past surgical history on file.  No family history on file.  Social History   Tobacco Use  . Smoking status: Never Smoker  . Smokeless tobacco: Never Used  Substance Use Topics  . Alcohol use: No  . Drug use: No    ROS   Objective:   Vitals: Pulse 63   Temp 98.7 F (37.1 C) (Oral)   Resp 18   SpO2 98%   Physical Exam Constitutional:      General: She is not in acute distress.    Appearance: Normal appearance. She is well-developed. She is not ill-appearing.  HENT:     Head: Normocephalic and atraumatic.     Jaw: Trismus,  tenderness (over area outlined) and pain on movement present. No swelling or malocclusion.      Nose: Nose normal.     Mouth/Throat:     Mouth: Mucous membranes are moist.     Pharynx: Oropharynx is clear.  Eyes:     General: No scleral icterus.    Extraocular Movements: Extraocular movements intact.     Pupils: Pupils are equal, round, and reactive to light.  Cardiovascular:     Rate and Rhythm: Normal rate.  Pulmonary:     Effort: Pulmonary effort is normal.  Skin:    General: Skin is warm and dry.  Neurological:     General: No focal deficit present.     Mental Status: She is alert and oriented to person, place, and time.  Psychiatric:        Mood and Affect: Mood normal.        Behavior: Behavior normal.     DG Mandible 1-3 Views  Result Date: 11/19/2020 CLINICAL DATA:  Pain EXAM: MANDIBLE - 1-3 VIEW COMPARISON:  None. FINDINGS: Lateral, oblique, and Towne's views obtained. No evident fracture or dislocation. Mandibular condyles appear aligned with the respective temporal eminences. IMPRESSION: No fracture or dislocation. No appreciable arthropathic change. Note that if there is clinical concern for potential temporomandibular meniscal subluxation, MR would be the optimum study of choice to further evaluate. Electronically Signed  By: Bretta Bang III M.D.   On: 11/19/2020 16:21     Assessment and Plan :   PDMP not reviewed this encounter.  1. Jaw pain   2. Injury of jaw, initial encounter     Recommended conservative management. Previously had itching with use of naproxen. Has used ibuprofen without any issues. Will use meloxicam, Flexeril. Follow up with ENT if symptoms persist for further evaluation and consideration for imaging if necessary. Counseled patient on potential for adverse effects with medications prescribed/recommended today, ER and return-to-clinic precautions discussed, patient verbalized understanding.    Wallis Bamberg, [place] 11/19/20 [phone]
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
